# Patient Record
Sex: Female | Born: 1980 | Race: Black or African American | Hispanic: No | Marital: Married | Smoking: Never smoker
Health system: Southern US, Community
[De-identification: ages and names within clinical notes are randomized; demographics above are authoritative.]

## PROBLEM LIST (undated history)

## (undated) DIAGNOSIS — I10 Essential (primary) hypertension: Secondary | ICD-10-CM

## (undated) HISTORY — PX: APPENDECTOMY: SHX54

---

## 2018-12-16 DIAGNOSIS — I10 Essential (primary) hypertension: Secondary | ICD-10-CM | POA: Insufficient documentation

## 2020-02-16 DIAGNOSIS — R87810 Cervical high risk human papillomavirus (HPV) DNA test positive: Secondary | ICD-10-CM | POA: Insufficient documentation

## 2020-02-16 DIAGNOSIS — R87610 Atypical squamous cells of undetermined significance on cytologic smear of cervix (ASC-US): Secondary | ICD-10-CM | POA: Insufficient documentation

## 2020-09-03 ENCOUNTER — Ambulatory Visit: Admit: 2020-09-03 | Disposition: A | Discharge status: Home / Self Care | Payer: Self-pay

## 2020-09-03 ENCOUNTER — Ambulatory Visit
Admission: EM | Admit: 2020-09-03 | Discharge: 2020-09-03 | Disposition: A | Discharge status: Home / Self Care | Payer: 59 | Attending: Family Medicine | Admitting: Family Medicine

## 2020-09-03 ENCOUNTER — Other Ambulatory Visit: Payer: Self-pay

## 2020-09-03 ENCOUNTER — Emergency Department: Payer: 59

## 2020-09-03 ENCOUNTER — Encounter: Payer: Self-pay | Admitting: *Deleted

## 2020-09-03 ENCOUNTER — Emergency Department
Admission: EM | Admit: 2020-09-03 | Discharge: 2020-09-03 | Disposition: A | Discharge status: Home / Self Care | Payer: 59 | Attending: Student in an Organized Health Care Education/Training Program | Admitting: Student in an Organized Health Care Education/Training Program

## 2020-09-03 DIAGNOSIS — I10 Essential (primary) hypertension: Secondary | ICD-10-CM

## 2020-09-03 DIAGNOSIS — R079 Chest pain, unspecified: Secondary | ICD-10-CM | POA: Diagnosis not present

## 2020-09-03 DIAGNOSIS — R519 Headache, unspecified: Secondary | ICD-10-CM | POA: Diagnosis present

## 2020-09-03 DIAGNOSIS — Z91040 Latex allergy status: Secondary | ICD-10-CM | POA: Diagnosis not present

## 2020-09-03 DIAGNOSIS — Z79899 Other long term (current) drug therapy: Secondary | ICD-10-CM | POA: Diagnosis not present

## 2020-09-03 DIAGNOSIS — R9431 Abnormal electrocardiogram [ECG] [EKG]: Secondary | ICD-10-CM | POA: Diagnosis not present

## 2020-09-03 HISTORY — DX: Essential (primary) hypertension: I10

## 2020-09-03 LAB — BASIC METABOLIC PANEL
Anion gap: 7 (ref 5–15)
BUN: 13 mg/dL (ref 6–20)
CO2: 23 mmol/L (ref 22–32)
Calcium: 9 mg/dL (ref 8.9–10.3)
Chloride: 107 mmol/L (ref 98–111)
Creatinine, Ser: 0.81 mg/dL (ref 0.44–1.00)
GFR, Estimated: >60 mL/min (ref >60)
Glucose, Bld: 82 mg/dL (ref 70–99)
Potassium: 4.2 mmol/L (ref 3.5–5.1)
Sodium: 137 mmol/L (ref 135–145)

## 2020-09-03 LAB — CBC
HCT: 39 % (ref 36.0–46.0)
Hemoglobin: 13 g/dL (ref 12.0–15.0)
MCH: 28.6 pg (ref 26.0–34.0)
MCHC: 33.3 g/dL (ref 30.0–36.0)
MCV: 85.7 fL (ref 80.0–100.0)
Platelets: 353 10*3/uL (ref 150–400)
RBC: 4.55 MIL/uL (ref 3.87–5.11)
RDW: 16 % — ABNORMAL HIGH (ref 11.5–15.5)
WBC: 5.8 10*3/uL (ref 4.0–10.5)
nRBC: 0 % (ref 0.0–0.2)

## 2020-09-03 LAB — TROPONIN I (HIGH SENSITIVITY)
Troponin I (High Sensitivity): 4 ng/L (ref <18)
Troponin I (High Sensitivity): 4 ng/L (ref <18)

## 2020-09-03 LAB — FIBRIN DERIVATIVES D-DIMER (ARMC ONLY): Fibrin derivatives D-dimer (ARMC): 258.11 ng/mL (FEU) (ref 0.00–499.00)

## 2020-09-03 MED ORDER — PROCHLORPERAZINE MALEATE 10 MG PO TABS
10.0000 mg | ORAL_TABLET | Freq: Once | ORAL | Status: DC
  Filled 2020-09-03: qty 1

## 2020-09-03 MED ORDER — PROCHLORPERAZINE MALEATE 5 MG PO TABS
5.0000 mg | ORAL_TABLET | Freq: Once | ORAL | Status: DC
  Filled 2020-09-03: qty 1

## 2020-09-03 NOTE — ED Triage Notes (Signed)
Pt sent from urgent care for eval of headache for 2 weeks and chest pain since this am.   No sob.  Nonsmoker.  Hx htn.  Pt taking otc meds with some relief.  Pt alert  Speech clear.

## 2020-09-03 NOTE — Discharge Instructions (Addendum)
Go to the ER for further evaluation and treatment. 

## 2020-09-03 NOTE — ED Provider Notes (Signed)
[facility]   [id number] 09/03/20 Arrival Time: 1243  CC: HEADACHE  SUBJECTIVE:  Penny Davis is a 39 y.o. female who complains of headache for the last week that has progressively gotten worse. Denies a precipitating event, or recent head trauma. Has taken OTC medications with [place] relief. Patient localizes pain to the right side of the head. Has hx hypertension and takes Lotrel 5-10mg  daily. Describes the pain as constant and throbbing in character. There are not alleviating or aggravating factors. Reports that she has been having increased blood pressures at home. Reports that she has been having an increase in the number of headaches she has had over the last year. States that when she checks her BP when she has a headache, it is usually high. Reports similar symptoms in the past that improved with time. Reports left sided chest and back pain that began today as well. States that she has never had chest pain like this before. This is not the worst headache of their life. Patient denies fever, chills, nausea, vomiting, aura, rhinorrhea, watery eyes,  SOB, abdominal pain, weakness, numbness or tingling, slurred speech.     ROS: As per HPI.  All other pertinent ROS negative.     Past Medical History:  Diagnosis Date  . Hypertension    Past Surgical History:  Procedure Laterality Date  . APPENDECTOMY     Allergies  Allergen Reactions  . Latex   . Sulfa Antibiotics    No current facility-administered medications on file prior to encounter.   Current Outpatient Medications on File Prior to Encounter  Medication Sig Dispense Refill  . amLODipine-benazepril (LOTREL) 5-10 MG capsule Take 1 capsule by mouth daily.     Social History   Socioeconomic History  . Marital status: Married    Spouse name: Not on file  . Number of children: Not on file  . Years of education: Not on file  . Highest education level: Not on file  Occupational History  . Not on file  Tobacco  Use  . Smoking status: Never Smoker  . Smokeless tobacco: Never Used  Substance and Sexual Activity  . Alcohol use: Not on file  . Drug use: Not on file  . Sexual activity: Not on file  Other Topics Concern  . Not on file  Social History Narrative  . Not on file   Social Determinants of Health   Financial Resource Strain:   . Difficulty of Paying Living Expenses: Not on file  Food Insecurity:   . Worried About Programme researcher, broadcasting/film/video in the Last Year: Not on file  . Ran Out of Food in the Last Year: Not on file  Transportation Needs:   . Lack of Transportation (Medical): Not on file  . Lack of Transportation (Non-Medical): Not on file  Physical Activity:   . Days of Exercise per Week: Not on file  . Minutes of Exercise per Session: Not on file  Stress:   . Feeling of Stress : Not on file  Social Connections:   . Frequency of Communication with Friends and Family: Not on file  . Frequency of Social Gatherings with Friends and Family: Not on file  . Attends Religious Services: Not on file  . Active Member of Clubs or Organizations: Not on file  . Attends Banker Meetings: Not on file  . Marital Status: Not on file  Intimate Partner Violence:   . Fear of Current or Ex-Partner: Not on file  . Emotionally  Abused: Not on file  . Physically Abused: Not on file  . Sexually Abused: Not on file   History reviewed. No pertinent family history.  OBJECTIVE:  Vitals:   09/03/20 1346  BP: (!) 137/101  Pulse: 69  Resp: 17  Temp: 98.8 F (37.1 C)  TempSrc: Oral  SpO2: 96%    General appearance: alert; no distress Eyes: PERRLA; EOMI HENT: normocephalic; atraumatic Neck: supple with FROM Lungs: clear to auscultation bilaterally Heart: regular rate and rhythm.  Radial pulses 2+ symmetrical bilaterally Extremities: no edema; symmetrical with no gross deformities Skin: warm and dry Neurologic: CN 2-12 grossly intact; finger to nose without difficulty; normal gait;  strength and sensation intact bilaterally about the upper and lower extremities; negative pronator drift Psychological: alert and cooperative; normal mood and affect   ASSESSMENT & PLAN:  1. Chest pain, unspecified type   2. Nonintractable headache, unspecified chronicity pattern, unspecified headache type   3. Essential hypertension   4. Nonspecific abnormal electrocardiogram (ECG) (EKG)     Discussed with patient that given her history of high blood pressure, chest pain and headache today that she would be better served in the ER for further workup Patient takes hormonal birth control as well EKG today shows nonspecific t-wave abnormality, shows possible anterior ischemia, no STEMI No previous EKG for comparison Also discussed that we cannot rule out PE in the office as well Patient declines EMS and states that she will follow up in the ER via personal vehicle Stable at discharge   Moshe Cipro, NP 09/03/20 1439

## 2020-09-03 NOTE — ED Triage Notes (Signed)
Patient reports right sided headache, constant in nature that has progressively worsened, x 1 week. Denies any associated symptoms with headache.

## 2020-09-03 NOTE — ED Provider Notes (Signed)
[facility] Emergency Department Provider Note    First MD Initiated Contact with Patient 09/03/20 1807     (approximate)  I have reviewed the triage vital signs and the nursing notes.   HISTORY  Chief Complaint Headache and Chest Pain    HPI Penny Davis is a 39 y.o. female with the below listed past medical history presents to ER from urgent care due to chest discomfort that started this morning.  No history of chest pain.  Has been having daily headaches for the past several days.  Has a history of chronic recurrent non-intractable headaches.  Has been taking over-the-counter medication.  She did not go to urgent care for the headache.  Denies any measured fevers.  Does have some discomfort with taking deep inspiration is on birth control.  No previous history of heart disease or ureter DVT.    Past Medical History:  Diagnosis Date  . Hypertension    No family history on file. Past Surgical History:  Procedure Laterality Date  . APPENDECTOMY     There are no problems to display for this patient.     Prior to Admission medications   Medication Sig Start Date End Date Taking? Authorizing Provider  amLODipine-benazepril (LOTREL) 5-10 MG capsule Take 1 capsule by mouth daily.    [provider]    Allergies Latex and Sulfa antibiotics    Social History Social History   Tobacco Use  . Smoking status: Never Smoker  . Smokeless tobacco: Never Used  Substance Use Topics  . Alcohol use: Not Currently  . Drug use: Not Currently    Review of Systems Patient denies headaches, rhinorrhea, blurry vision, numbness, shortness of breath, chest pain, edema, cough, abdominal pain, nausea, vomiting, diarrhea, dysuria, fevers, rashes or hallucinations unless otherwise stated above in HPI. ____________________________________________   PHYSICAL EXAM:  VITAL SIGNS: Vitals:   09/03/20 1534 09/03/20 1830  BP: (!) 141/83 (!) 151/99    Pulse: 74 66  Resp: 18 19  Temp: 98.2 F (36.8 C)   SpO2: 99% 100%    Constitutional: Alert and oriented.  Eyes: Conjunctivae are normal.  Head: Atraumatic. Nose: No congestion/rhinnorhea. Mouth/Throat: Mucous membranes are moist.   Neck: No stridor. Painless ROM.  Cardiovascular: Normal rate, regular rhythm. Grossly normal heart sounds.  Good peripheral circulation. Respiratory: Normal respiratory effort.  No retractions. Lungs CTAB. Gastrointestinal: Soft and nontender. No distention. No abdominal bruits. No CVA tenderness. Genitourinary:  Musculoskeletal: No lower extremity tenderness nor edema.  No joint effusions. Neurologic:  Normal speech and language. No gross focal neurologic deficits are appreciated. No facial droop Skin:  Skin is warm, dry and intact. No rash noted. Psychiatric: Mood and affect are normal. Speech and behavior are normal.  ____________________________________________   LABS (all labs ordered are listed, but only abnormal results are displayed)  Results for orders placed or performed during the hospital encounter of 09/03/20 (from the past 24 hour(s))  Basic metabolic panel     Status: None   Collection Time: 09/03/20  3:36 PM  Result Value Ref Range   Sodium 137 135 - 145 mmol/L   Potassium 4.2 3.5 - 5.1 mmol/L   Chloride 107 98 - 111 mmol/L   CO2 23 22 - 32 mmol/L   Glucose, Bld 82 70 - 99 mg/dL   BUN 13 6 - 20 mg/dL   Creatinine, Ser 4.76 0.44 - 1.00 mg/dL   Calcium 9.0 8.9 - 54.6 mg/dL   GFR, Estimated >50 >35  mL/min   Anion gap 7 5 - 15  CBC     Status: Abnormal   Collection Time: 09/03/20  3:36 PM  Result Value Ref Range   WBC 5.8 4.0 - 10.5 K/uL   RBC 4.55 3.87 - 5.11 MIL/uL   Hemoglobin 13.0 12.0 - 15.0 g/dL   HCT 42.3 36 - 46 %   MCV 85.7 80.0 - 100.0 fL   MCH 28.6 26.0 - 34.0 pg   MCHC 33.3 30.0 - 36.0 g/dL   RDW 53.6 (H) 14.4 - 31.5 %   Platelets 353 150 - 400 K/uL   nRBC 0.0 0.0 - 0.2 %  Troponin I (High Sensitivity)      Status: None   Collection Time: 09/03/20  3:36 PM  Result Value Ref Range   Troponin I (High Sensitivity) 4 <18 ng/L  Troponin I (High Sensitivity)     Status: None   Collection Time: 09/03/20  6:32 PM  Result Value Ref Range   Troponin I (High Sensitivity) 4 <18 ng/L  Fibrin derivatives D-Dimer ([facility] only)     Status: None   Collection Time: 09/03/20  6:32 PM  Result Value Ref Range   Fibrin derivatives D-dimer ([facility]) 258.11 0.00 - 499.00 ng/mL (FEU)   ____________________________________________  EKG My review and personal interpretation at Time: 15:35   Indication: chest pain  Rate: 65  Rhythm: sinus Axis: normal Other: normal intervals, nonspecific twave abn ____________________________________________  RADIOLOGY  I personally reviewed all radiographic images ordered to evaluate for the above acute complaints and reviewed radiology reports and findings.  These findings were personally discussed with the patient.  Please see medical record for radiology report.  ____________________________________________   PROCEDURES  Procedure(s) performed:  Procedures    Critical Care performed: no ____________________________________________   INITIAL IMPRESSION / ASSESSMENT AND PLAN / ED COURSE  Pertinent labs & imaging results that were available during my care of the patient were reviewed by me and considered in my medical decision making (see chart for details).   DDX: ACS, pericarditis, esophagitis, boerhaaves, pe, dissection, pna, bronchitis, costochondritis   Penny Davis is a 39 y.o. who presents to the ED with symptoms as described above.  Patient is nontoxic-appearing with reassuring physical exam.  EKG with some nonspecific T wave abnormalities.  May be secondary to her history of hypertension.  She is low risk by Wells criteria not hypoxic not tachycardic but she is on birth control therefore will send off D-dimer to further stratify for PE.  Does not seem  consistent with pneumothorax, CHF, infectious process.  Clinical Course as of Sep 03 1917  Mon Sep 03, 2020  1916 Patient reassessed.  Declining any analgesics.  Cardiac pulmonary work-up is reassuring.  Not consistent with PE or ACS.  Not consistent dissection.  Exam is otherwise reassuring.  Appropriate for outpatient follow-up.   [PR]    Clinical Course User Index [PR] Willy Eddy, MD    The patient was evaluated in Emergency Department today for the symptoms described in the history of present illness. He/she was evaluated in the context of the global COVID-19 pandemic, which necessitated consideration that the patient might be at risk for infection with the SARS-CoV-2 virus that causes COVID-19. Institutional protocols and algorithms that pertain to the evaluation of patients at risk for COVID-19 are in a state of rapid change based on information released by regulatory bodies including the CDC and federal and state organizations. These policies and algorithms were followed during the patient's  care in the ED.  As part of my medical decision making, I reviewed the following data within the electronic MEDICAL RECORD NUMBER Nursing notes reviewed and incorporated, Labs reviewed, notes from prior ED visits and [place] Controlled Substance Database   ____________________________________________   FINAL CLINICAL IMPRESSION(S) / ED DIAGNOSES  Final diagnoses:  Chest pain, unspecified type  Hypertension, unspecified type      NEW MEDICATIONS STARTED DURING THIS VISIT:  New Prescriptions   No medications on file     Note:  This document was prepared using Dragon voice recognition software and may include unintentional dictation errors.    Willy Eddy, MD 09/03/20 [phone]

## 2020-09-13 ENCOUNTER — Other Ambulatory Visit: Payer: Self-pay

## 2020-09-13 ENCOUNTER — Encounter: Payer: Self-pay | Admitting: Cardiology

## 2020-09-13 ENCOUNTER — Telehealth: Payer: Self-pay | Admitting: Cardiology

## 2020-09-13 ENCOUNTER — Ambulatory Visit: Payer: 59 | Admitting: Cardiology

## 2020-09-13 VITALS — BP 120/88 | HR 72 | Ht 66.0 in | Wt 202.0 lb

## 2020-09-13 DIAGNOSIS — R072 Precordial pain: Secondary | ICD-10-CM

## 2020-09-13 DIAGNOSIS — I10 Essential (primary) hypertension: Secondary | ICD-10-CM

## 2020-09-13 DIAGNOSIS — R079 Chest pain, unspecified: Secondary | ICD-10-CM

## 2020-09-13 MED ORDER — METOPROLOL TARTRATE 100 MG PO TABS: 100.0000 mg | ORAL_TABLET | Freq: Once | ORAL | 0 refills | Status: AC

## 2020-09-13 NOTE — Telephone Encounter (Signed)
Per checkout needs fu ct and echo

## 2020-09-13 NOTE — Progress Notes (Signed)
Cardiology Office Note:    Date:  09/13/2020   ID:  Penny Davis, DOB 12-Oct-1981, MRN [id number]  PCP:  Wyman Songster, NP  [facility] Cardiologist:  Kate Sable, MD  [place] Electrophysiologist:  None   Referring MD: Merlyn Lot, MD   Chief Complaint  Patient presents with  . New Patient (Initial Visit)    Establish care with provider for abnormal EKG and chest pains. Per patient she is still having headaches and chest pains off and on. Medications verbally reviewed with patient.    Penny Davis is a 39 y.o. female who is being seen today for the evaluation of abnormal EKG, chest pain at the request of Merlyn Lot, MD.   History of Present Illness:    Penny Davis is a 39 y.o. female with a hx of hypertension who presents due to chest pain and abnormal ECG.  Patient states having a 2 to 3-day history of chest pain about a week ago.  She was at the grocery store with her husband when symptoms began.  Describes symptoms as pressure, 5 out of 10 in severity located in the mid chest section.  Symptoms were persistent prompting her to go to an urgent care.  EKG at the urgent care was noted to be of normal.  She was then referred to the ED.  Patient was seen in the ED on 10/25 due to chest pain.  Troponins were normal, EKG showed nonspecific T wave changes.  D-dimers were normal.  Outpatient follow-up with cardiology was recommended.  She has since had occasional chest pain on and off not related with exertion.  Endorses shortness of breath with exertion especially when going up stairs.  Denies smoking, or history of heart disease.  Past Medical History:  Diagnosis Date  . Hypertension     Past Surgical History:  Procedure Laterality Date  . APPENDECTOMY      Current Medications: Current Meds  Medication Sig  . amLODipine (NORVASC) 5 MG tablet Take by mouth.  . medroxyPROGESTERone Acetate 150 MG/ML SUSY Inject 1 mL into the muscle every 3  (three) months.     Allergies:   Latex and Sulfa antibiotics   Social History   Socioeconomic History  . Marital status: Married    Spouse name: Not on file  . Number of children: Not on file  . Years of education: Not on file  . Highest education level: Not on file  Occupational History  . Not on file  Tobacco Use  . Smoking status: Never Smoker  . Smokeless tobacco: Never Used  Substance and Sexual Activity  . Alcohol use: Not Currently  . Drug use: Not Currently  . Sexual activity: Yes  Other Topics Concern  . Not on file  Social History Narrative  . Not on file   Social Determinants of Health   Financial Resource Strain:   . Difficulty of Paying Living Expenses: Not on file  Food Insecurity:   . Worried About Charity fundraiser in the Last Year: Not on file  . Ran Out of Food in the Last Year: Not on file  Transportation Needs:   . Lack of Transportation (Medical): Not on file  . Lack of Transportation (Non-Medical): Not on file  Physical Activity:   . Days of Exercise per Week: Not on file  . Minutes of Exercise per Session: Not on file  Stress:   . Feeling of Stress : Not on file  Social Connections:   .  Frequency of Communication with Friends and Family: Not on file  . Frequency of Social Gatherings with Friends and Family: Not on file  . Attends Religious Services: Not on file  . Active Member of Clubs or Organizations: Not on file  . Attends Archivist Meetings: Not on file  . Marital Status: Not on file     Family History: The patient's family history is not on file.  ROS:   Please see the history of present illness.     All other systems reviewed and are negative.  EKGs/Labs/Other Studies Reviewed:    The following studies were reviewed today:   EKG:  EKG is  ordered today.  The ekg ordered today demonstrates normal sinus rhythm  Recent Labs: 09/03/2020: BUN 13; Creatinine, Ser 0.81; Hemoglobin 13.0; Platelets 353; Potassium 4.2;  Sodium 137  Recent Lipid Panel No results found for: CHOL, TRIG, HDL, CHOLHDL, VLDL, LDLCALC, LDLDIRECT   Risk Assessment/Calculations:      Physical Exam:    VS:  BP 120/88 (BP Location: Right Arm, Patient Position: Sitting, Cuff Size: Normal)   Pulse 72   Ht $R'5\' 6"'IA$  (1.676 m)   Wt 202 lb (91.6 kg)   LMP  (LMP Unknown)   SpO2 98%   BMI 32.60 kg/m     Wt Readings from Last 3 Encounters:  09/13/20 202 lb (91.6 kg)  09/03/20 202 lb (91.6 kg)     GEN:  Well nourished, well developed in no acute distress HEENT: Normal NECK: No JVD; No carotid bruits LYMPHATICS: No lymphadenopathy CARDIAC: RRR, no murmurs, rubs, gallops RESPIRATORY:  Clear to auscultation without rales, wheezing or rhonchi  ABDOMEN: Soft, non-tender, non-distended MUSCULOSKELETAL:  No edema; tenderness on the left chest noted on exam, different location from prior chest pain occurrence. SKIN: Warm and dry NEUROLOGIC:  Alert and oriented x 3 PSYCHIATRIC:  Normal affect   ASSESSMENT:    1. Chest pain of uncertain etiology   2. Primary hypertension   3. Precordial pain    PLAN:    In order of problems listed above:  1. Patient with chest pain, risk factors of hypertension.  She also endorses dyspnea on exertion.  Will evaluate patient with echocardiogram for any cardiac dysfunction.  Get coronary CTA to evaluate presence of CAD. 2. History of hypertension, BP controlled on Norvasc.  Follow-up after echo and coronary CTA.   Medication Adjustments/Labs and Tests Ordered: Current medicines are reviewed at length with the patient today.  Concerns regarding medicines are outlined above.  Orders Placed This Encounter  Procedures  . CT CORONARY MORPH W/CTA COR W/SCORE W/CA W/CM &/OR WO/CM  . CT CORONARY FRACTIONAL FLOW RESERVE DATA PREP  . CT CORONARY FRACTIONAL FLOW RESERVE FLUID ANALYSIS  . EKG 12-Lead  . ECHOCARDIOGRAM COMPLETE   Meds ordered this encounter  Medications  . metoprolol tartrate  (LOPRESSOR) 100 MG tablet    Sig: Take 1 tablet (100 mg total) by mouth once for 1 dose. Take 2 hours prior to your CT scan.    Dispense:  1 tablet    Refill:  0    Patient Instructions  Medication Instructions:   Your physician recommends that you continue on your current medications as directed. Please refer to the Current Medication list given to you today.  *If you need a refill on your cardiac medications before your next appointment, please call your pharmacy*   Lab Work: None Ordered If you have labs (blood work) drawn today and your tests are  completely normal, you will receive your results only by: Marland Kitchen MyChart Message (if you have MyChart) OR . A paper copy in the mail If you have any lab test that is abnormal or we need to change your treatment, we will call you to review the results.   Testing/Procedures:  1.  Your physician has requested that you have an echocardiogram. Echocardiography is a painless test that uses sound waves to create images of your heart. It provides your doctor with information about the size and shape of your heart and how well your heart's chambers and valves are working. This procedure takes approximately one hour. There are no restrictions for this procedure.  2.  Your physician has requested that you have cardiac CT. Cardiac computed tomography (CT) is a painless test that uses an x-ray machine to take clear, detailed pictures of your heart.  Your cardiac CT will be scheduled at:  [facility] [address] [place], [place] [address] [phone]  Please arrive 15 mins early for check-in and test prep.   Please follow these instructions carefully (unless otherwise directed):   On the Night Before the Test:   HOLD Norvasc the night prior to your test . Be sure to Drink plenty of water. . Do not consume any caffeinated/decaffeinated beverages or chocolate 12 hours prior to your test. . Do not  take any antihistamines 12 hours prior to your test.   On the Day of the Test: . Drink plenty of water. Do not drink any water within one hour of the test. . Do not eat any food 4 hours prior to the test. . You may take your regular medications prior to the test.  . Take metoprolol (Lopressor) two hours prior to test. . FEMALES- please wear underwire-free bra if available        After the Test: . Drink plenty of water. . After receiving IV contrast, you may experience a mild flushed feeling. This is normal. . On occasion, you may experience a mild rash up to 24 hours after the test. This is not dangerous. If this occurs, you can take Benadryl 25 mg and increase your fluid intake. . If you experience trouble breathing, this can be serious. If it is severe call 911 IMMEDIATELY. If it is mild, please call our office. . If you take any of these medications: Glipizide/Metformin, Avandament, Glucavance, please do not take 48 hours after completing test unless otherwise instructed.   Once we have confirmed authorization from your insurance company, we will call you to set up a date and time for your test. Based on how quickly your insurance processes prior authorizations requests, please allow up to 4 weeks to be contacted for scheduling your Cardiac CT appointment. Be advised that routine Cardiac CT appointments could be scheduled as many as 8 weeks after your provider has ordered it.  For non-scheduling related questions, please contact the cardiac imaging nurse navigator should you have any questions/concerns: Marchia Bond, Cardiac Imaging Nurse Navigator Burley Saver, Interim Cardiac Imaging Nurse [place] [facility] Direct Office Dial: [phone]   For scheduling needs, including cancellations and rescheduling, please call Vivien Rota at [phone], option 3.     Follow-Up: At [facility], you and your health needs are our priority.  As part of our continuing  mission to provide you with exceptional heart care, we have created designated Provider Care Teams.  These Care Teams include your primary Cardiologist (physician) and Advanced  Practice Providers (APPs -  Physician Assistants and Nurse Practitioners) who all work together to provide you with the care you need, when you need it.  We recommend signing up for the patient portal called "MyChart".  Sign up information is provided on this After Visit Summary.  MyChart is used to connect with patients for Virtual Visits (Telemedicine).  Patients are able to view lab/test results, encounter notes, upcoming appointments, etc.  Non-urgent messages can be sent to your provider as well.   To learn more about what you can do with MyChart, go to [email or web address].    Your next appointment:   Follow up after Echo and CTA   The format for your next appointment:   In Person  Provider:   Kate Sable, MD   Other Instructions      Signed, Kate Sable, MD  09/13/2020 11:45 AM    Mobile

## 2020-09-13 NOTE — Patient Instructions (Signed)
Medication Instructions:   Your physician recommends that you continue on your current medications as directed. Please refer to the Current Medication list given to you today.  *If you need a refill on your cardiac medications before your next appointment, please call your pharmacy*   Lab Work: None Ordered If you have labs (blood work) drawn today and your tests are completely normal, you will receive your results only by: Marland Kitchen MyChart Message (if you have MyChart) OR . A paper copy in the mail If you have any lab test that is abnormal or we need to change your treatment, we will call you to review the results.   Testing/Procedures:  1.  Your physician has requested that you have an echocardiogram. Echocardiography is a painless test that uses sound waves to create images of your heart. It provides your doctor with information about the size and shape of your heart and how well your heart's chambers and valves are working. This procedure takes approximately one hour. There are no restrictions for this procedure.  2.  Your physician has requested that you have cardiac CT. Cardiac computed tomography (CT) is a painless test that uses an x-ray machine to take clear, detailed pictures of your heart.  Your cardiac CT will be scheduled at:  [facility] [address] [place], [place] [address] [phone]  Please arrive 15 mins early for check-in and test prep.   Please follow these instructions carefully (unless otherwise directed):   On the Night Before the Test:   HOLD Norvasc the night prior to your test . Be sure to Drink plenty of water. . Do not consume any caffeinated/decaffeinated beverages or chocolate 12 hours prior to your test. . Do not take any antihistamines 12 hours prior to your test.   On the Day of the Test: . Drink plenty of water. Do not drink any water within one hour of the test. . Do not eat any food 4 hours  prior to the test. . You may take your regular medications prior to the test.  . Take metoprolol (Lopressor) two hours prior to test. . FEMALES- please wear underwire-free bra if available        After the Test: . Drink plenty of water. . After receiving IV contrast, you may experience a mild flushed feeling. This is normal. . On occasion, you may experience a mild rash up to 24 hours after the test. This is not dangerous. If this occurs, you can take Benadryl 25 mg and increase your fluid intake. . If you experience trouble breathing, this can be serious. If it is severe call 911 IMMEDIATELY. If it is mild, please call our office. . If you take any of these medications: Glipizide/Metformin, Avandament, Glucavance, please do not take 48 hours after completing test unless otherwise instructed.   Once we have confirmed authorization from your insurance company, we will call you to set up a date and time for your test. Based on how quickly your insurance processes prior authorizations requests, please allow up to 4 weeks to be contacted for scheduling your Cardiac CT appointment. Be advised that routine Cardiac CT appointments could be scheduled as many as 8 weeks after your provider has ordered it.  For non-scheduling related questions, please contact the cardiac imaging nurse navigator should you have any questions/concerns: Marchia Bond, Cardiac Imaging Nurse Navigator Burley Saver, Interim Cardiac Imaging Nurse [place] [facility] Direct Office Dial: [phone]  For scheduling needs, including cancellations and rescheduling, please call Vivien Rota at [phone], option 3.     Follow-Up: At [facility], you and your health needs are our priority.  As part of our continuing mission to provide you with exceptional heart care, we have created designated Provider Care Teams.  These Care Teams include your primary Cardiologist (physician) and Advanced Practice  Providers (APPs -  Physician Assistants and Nurse Practitioners) who all work together to provide you with the care you need, when you need it.  We recommend signing up for the patient portal called "MyChart".  Sign up information is provided on this After Visit Summary.  MyChart is used to connect with patients for Virtual Visits (Telemedicine).  Patients are able to view lab/test results, encounter notes, upcoming appointments, etc.  Non-urgent messages can be sent to your provider as well.   To learn more about what you can do with MyChart, go to [email or web address].    Your next appointment:   Follow up after Echo and CTA   The format for your next appointment:   In Person  Provider:   Kate Sable, MD   Other Instructions

## 2020-09-21 ENCOUNTER — Ambulatory Visit: Payer: 59 | Admitting: Cardiology

## 2020-09-26 ENCOUNTER — Telehealth (HOSPITAL_COMMUNITY): Payer: Self-pay | Admitting: *Deleted

## 2020-09-26 NOTE — Telephone Encounter (Signed)

## 2020-09-27 ENCOUNTER — Other Ambulatory Visit: Payer: Self-pay

## 2020-09-27 ENCOUNTER — Ambulatory Visit
Admission: RE | Admit: 2020-09-27 | Discharge: 2020-09-27 | Disposition: A | Discharge status: Home / Self Care | Payer: 59 | Source: Ambulatory Visit | Attending: Cardiology | Admitting: Cardiology

## 2020-09-27 DIAGNOSIS — R072 Precordial pain: Secondary | ICD-10-CM

## 2020-09-27 MED ORDER — DILTIAZEM HCL 25 MG/5ML IV SOLN: 10.0000 mg | Freq: Once | INTRAVENOUS | Status: DC

## 2020-09-27 MED ORDER — IOHEXOL 350 MG/ML SOLN
100.0000 mL | Freq: Once | INTRAVENOUS | Status: AC | PRN
  Administered 2020-09-27: 85 mL via INTRAVENOUS

## 2020-09-27 MED ORDER — NITROGLYCERIN 0.4 MG SL SUBL
0.8000 mg | SUBLINGUAL_TABLET | Freq: Once | SUBLINGUAL | Status: AC
  Administered 2020-09-27: 0.8 mg via SUBLINGUAL

## 2020-09-27 NOTE — Progress Notes (Signed)
Patient tolerated procedure well. Ambulate w/o difficulty. Sitting in chair drinking water provided. Encouraged to drink extra water today and reasoning explained. Verbalized understanding. All questions answered. ABC intact. No further needs. Discharge from procedure area w/o issues.  

## 2020-10-09 ENCOUNTER — Ambulatory Visit: Payer: 59 | Admitting: Podiatry

## 2020-10-09 ENCOUNTER — Other Ambulatory Visit: Payer: Self-pay

## 2020-10-09 ENCOUNTER — Ambulatory Visit (INDEPENDENT_AMBULATORY_CARE_PROVIDER_SITE_OTHER): Payer: 59

## 2020-10-09 DIAGNOSIS — B351 Tinea unguium: Secondary | ICD-10-CM | POA: Diagnosis not present

## 2020-10-09 DIAGNOSIS — R079 Chest pain, unspecified: Secondary | ICD-10-CM | POA: Diagnosis not present

## 2020-10-09 DIAGNOSIS — M2012 Hallux valgus (acquired), left foot: Secondary | ICD-10-CM | POA: Diagnosis not present

## 2020-10-09 DIAGNOSIS — M79675 Pain in left toe(s): Secondary | ICD-10-CM

## 2020-10-09 DIAGNOSIS — M79674 Pain in right toe(s): Secondary | ICD-10-CM

## 2020-10-09 DIAGNOSIS — L600 Ingrowing nail: Secondary | ICD-10-CM | POA: Diagnosis not present

## 2020-10-09 MED ORDER — TERBINAFINE HCL 250 MG PO TABS: 250.0000 mg | ORAL_TABLET | Freq: Every day | ORAL | 0 refills | Status: AC

## 2020-10-09 NOTE — Progress Notes (Signed)
   Subjective: 39 y.o. female presents today as a new patient for evaluation of a bunion that is developed to the left foot over the past few years.  Patient states is minimally symptomatic but it is more so unsightly.  She would like to discuss treatment options Patient also states that she has suffered for years for ingrown toenails.  She routinely goes to a pedicure salon to have them trimmed with minimal relief.  She has constant aches and pains to the bilateral medial aspect of the hallux nail plates. Finally the patient states that most recently she began going to a new pedicure salon.  When she began going she noticed discoloration and possible toenail fungus to the bilateral hallux nail plates lateral distal corner   Past Medical History:  Diagnosis Date  . Hypertension       Objective: Physical Exam General: The patient is alert and oriented x3 in no acute distress.  Dermatology: Skin is cool, dry and supple bilateral lower extremities. Negative for open lesions or macerations.  Incurvated nail noted to the medial border of the bilateral great toes consistent with a chronic ingrown toenail Hyperkeratotic dystrophic discolored nails also noted to the distal lateral portion of the bilateral hallux nail plates  Vascular: Palpable pedal pulses bilaterally. No edema or erythema noted. Capillary refill within normal limits.  Neurological: Epicritic and protective threshold grossly intact bilaterally.   Musculoskeletal Exam: Clinical evidence of bunion deformity noted to the respective foot. There is moderate pain on palpation range of motion of the first MPJ. Lateral deviation of the hallux noted consistent with hallux abductovalgus.  Radiographic Exam: Increased intermetatarsal angle greater than 15 with a hallux abductus angle greater than 30 noted on AP view. Moderate degenerative changes noted within the first MPJ.  Assessment: 1. HAV w/ bunion deformity left 2.  Onychomycosis  of toenails bilateral great toes 3.  Ingrown toenails bilateral medial borders   Plan of Care:  1. Patient was evaluated. X-Rays reviewed. 2.  The patient would like to wait till after the holidays to address the ingrown toenails.  At that time we will likely perform partial permanent nail matricectomy 3.  Prescription for Lamisil 250 mg #90 daily.  Patient denies any liver pathology or history of liver impairment 4.  Return to clinic after the holidays for nail matricectomy procedures      Felecia Shelling, DPM [facility]  Dr. Felecia Shelling, DPM    [address]                                        [place], [place] [address]                Office [phone]  Fax [phone]

## 2020-10-10 LAB — ECHOCARDIOGRAM COMPLETE
Area-P 1/2: 3.48 cm2
S' Lateral: 3.2 cm

## 2020-10-12 ENCOUNTER — Telehealth: Payer: Self-pay

## 2020-10-12 NOTE — Telephone Encounter (Signed)
Patient received vm from nurse to review results.  She has read on mychart and understands normal results.   Patient has no further questions about results but wants to know if scheduled fu is now needed.    Patient is ok with leaving a detailed msg

## 2020-10-12 NOTE — Telephone Encounter (Signed)
Left a VM for patient to call back for Echo results.

## 2020-10-15 NOTE — Telephone Encounter (Signed)
Spoke with patient and gave her Dr. Merita Norton below copied and pasted response:  Okay to follow-up as needed   Routing comment    I cancelled appointment for patient and she was very grateful for the follow up.

## 2020-10-26 ENCOUNTER — Ambulatory Visit: Payer: 59 | Admitting: Cardiology

## 2020-11-13 ENCOUNTER — Ambulatory Visit: Payer: 59 | Admitting: Podiatry

## 2022-05-18 ENCOUNTER — Emergency Department: Payer: BC Managed Care – PPO

## 2022-05-18 ENCOUNTER — Encounter: Payer: Self-pay | Admitting: Intensive Care

## 2022-05-18 ENCOUNTER — Other Ambulatory Visit: Payer: Self-pay

## 2022-05-18 ENCOUNTER — Emergency Department
Admission: EM | Admit: 2022-05-18 | Discharge: 2022-05-18 | Disposition: A | Discharge status: Home / Self Care | Payer: BC Managed Care – PPO | Attending: Student in an Organized Health Care Education/Training Program | Admitting: Student in an Organized Health Care Education/Training Program

## 2022-05-18 DIAGNOSIS — M7989 Other specified soft tissue disorders: Secondary | ICD-10-CM

## 2022-05-18 DIAGNOSIS — R600 Localized edema: Secondary | ICD-10-CM | POA: Insufficient documentation

## 2022-05-18 DIAGNOSIS — M79662 Pain in left lower leg: Secondary | ICD-10-CM | POA: Diagnosis not present

## 2022-05-18 DIAGNOSIS — M79661 Pain in right lower leg: Secondary | ICD-10-CM | POA: Insufficient documentation

## 2022-05-18 DIAGNOSIS — I10 Essential (primary) hypertension: Secondary | ICD-10-CM | POA: Insufficient documentation

## 2022-05-18 LAB — COMPREHENSIVE METABOLIC PANEL
ALT: 15 U/L (ref 0–44)
AST: 19 U/L (ref 15–41)
Albumin: 3.8 g/dL (ref 3.5–5.0)
Alkaline Phosphatase: 58 U/L (ref 38–126)
Anion gap: 8 (ref 5–15)
BUN: 13 mg/dL (ref 6–20)
CO2: 24 mmol/L (ref 22–32)
Calcium: 8.8 mg/dL — ABNORMAL LOW (ref 8.9–10.3)
Chloride: 108 mmol/L (ref 98–111)
Creatinine, Ser: 0.83 mg/dL (ref 0.44–1.00)
GFR, Estimated: >60 mL/min (ref >60)
Glucose, Bld: 103 mg/dL — ABNORMAL HIGH (ref 70–99)
Potassium: 3.5 mmol/L (ref 3.5–5.1)
Sodium: 140 mmol/L (ref 135–145)
Total Bilirubin: 0.7 mg/dL (ref 0.3–1.2)
Total Protein: 7.5 g/dL (ref 6.5–8.1)

## 2022-05-18 LAB — CBC WITH DIFFERENTIAL/PLATELET
Abs Immature Granulocytes: 0.01 10*3/uL (ref 0.00–0.07)
Basophils Absolute: 0 10*3/uL (ref 0.0–0.1)
Basophils Relative: 0 %
Eosinophils Absolute: 0.2 10*3/uL (ref 0.0–0.5)
Eosinophils Relative: 3 %
HCT: 40.2 % (ref 36.0–46.0)
Hemoglobin: 13.2 g/dL (ref 12.0–15.0)
Immature Granulocytes: 0 %
Lymphocytes Relative: 40 %
Lymphs Abs: 1.9 10*3/uL (ref 0.7–4.0)
MCH: 30 pg (ref 26.0–34.0)
MCHC: 32.8 g/dL (ref 30.0–36.0)
MCV: 91.4 fL (ref 80.0–100.0)
Monocytes Absolute: 0.4 10*3/uL (ref 0.1–1.0)
Monocytes Relative: 8 %
Neutro Abs: 2.4 10*3/uL (ref 1.7–7.7)
Neutrophils Relative %: 49 %
Platelets: 287 10*3/uL (ref 150–400)
RBC: 4.4 MIL/uL (ref 3.87–5.11)
RDW: 13.2 % (ref 11.5–15.5)
WBC: 4.9 10*3/uL (ref 4.0–10.5)
nRBC: 0 % (ref 0.0–0.2)

## 2022-05-18 MED ORDER — CEPHALEXIN 500 MG PO CAPS: 500.0000 mg | ORAL_CAPSULE | Freq: Four times a day (QID) | ORAL | 0 refills | Status: AC

## 2022-05-18 NOTE — ED Triage Notes (Signed)
Patient has bilateral leg swelling. Right is worse. Redness and warm to touch noted.

## 2022-05-18 NOTE — ED Notes (Signed)
See triage note  Presents with swelling to both legs   Right leg is more swollen   and warm to touch

## 2022-05-18 NOTE — Discharge Instructions (Addendum)
-  Take all of antibiotics as prescribed.  -Please follow-up with your regular doctor if your symptoms fail to improve despite treatment.  -Return to the emergency department anytime if you begin to experience any new or worsening symptoms.

## 2022-05-18 NOTE — ED Provider Notes (Signed)
[facility] Provider Note    Event Date/Time   First MD Initiated Contact with Patient 05/18/22 1447     (approximate)   History   Chief Complaint Leg Swelling (bilateral)   HPI Penny Davis is a 41 y.o. female, history of hypertension, presents emergency department for evaluation of bilateral leg swelling.  Patient states that this been gradually developing over the past 2 weeks, noticed increased redness over the past few days, particularly on the right lower extremity.  Reports some pain and warmth along the lower legs.  No history of blood clots.  Denies recent surgeries.  Denies any recent environmental exposures or injuries.  Denies fever/chills, chest pain, shortness of breath, abdominal pain, flank pain, nausea/vomiting, diarrhea, dysuria, cold sensation in the lower extremities, or numbness/tingling in upper or lower extremities.  History Limitations: No limitations.        Physical Exam  Triage Vital Signs: ED Triage Vitals  Enc Vitals Group     BP 05/18/22 1430 (!) 158/106     Pulse Rate 05/18/22 1430 89     Resp 05/18/22 1430 16     Temp 05/18/22 1430 98.3 F (36.8 C)     Temp Source 05/18/22 1430 Oral     SpO2 05/18/22 1430 100 %     Weight 05/18/22 1431 228 lb (103.4 kg)     Height 05/18/22 1431 5\' 6"  (1.676 m)     Head Circumference --      Peak Flow --      Pain Score 05/18/22 1430 3     Pain Loc --      Pain Edu? --      Excl. in [place]? --     Most recent vital signs: Vitals:   05/18/22 1430  BP: (!) 158/106  Pulse: 89  Resp: 16  Temp: 98.3 F (36.8 C)  SpO2: 100%    General: Awake, NAD.  Skin: Warm, dry. No rashes or lesions.  Eyes: PERRL. Conjunctivae normal.  CV: Good peripheral perfusion.  Resp: Normal effort.  Abd: Soft, non-tender. No distention.  Neuro: At baseline. No gross neurological deficits.   Focused Exam: 2+ pitting edema bilaterally in the lower extremities, terminating along the mid calf region.   Warmth and erythema appreciated on the right lower extremity.  PMS intact distally in both lower extremities.  Patient maintains normal range of motion.  She is able to ambulate well on her own without assistance.  Physical Exam    ED Results / Procedures / Treatments  Labs (all labs ordered are listed, but only abnormal results are displayed) Labs Reviewed  COMPREHENSIVE METABOLIC PANEL - Abnormal; Notable for the following components:      Result Value   Glucose, Bld 103 (*)    Calcium 8.8 (*)    All other components within normal limits  CBC WITH DIFFERENTIAL/PLATELET     EKG N/A.   RADIOLOGY  ED Provider Interpretation: I personally reviewed and interpreted this ultrasound, no evidence of DVTs in either extremity.  07/19/22 Venous Img Lower Bilateral  Result Date: 05/18/2022 CLINICAL DATA:  Bilateral lower extremity swelling, right greater than left. EXAM: BILATERAL LOWER EXTREMITY VENOUS DOPPLER ULTRASOUND TECHNIQUE: Gray-scale sonography with compression, as well as color and duplex ultrasound, were performed to evaluate the deep venous system(s) from the level of the common femoral vein through the popliteal and proximal calf veins. COMPARISON:  None Available. FINDINGS: VENOUS Normal compressibility of the common femoral, superficial femoral, and popliteal veins,  as well as the visualized calf veins. Visualized portions of profunda femoral vein and great saphenous vein unremarkable. No filling defects to suggest DVT on grayscale or color Doppler imaging. Doppler waveforms show normal direction of venous flow, normal respiratory plasticity and response to augmentation. Limited views of the contralateral common femoral vein are unremarkable. OTHER None. Limitations: none IMPRESSION: Negative. Electronically Signed   By: Amie Portland M.D.   On: 05/18/2022 16:10    PROCEDURES:  Critical Care performed: N/A.    Procedures    MEDICATIONS ORDERED IN ED: Medications - No data to  display   IMPRESSION / MDM / ASSESSMENT AND PLAN / ED COURSE  I reviewed the triage vital signs and the nursing notes.                              Differential diagnosis includes, but is not limited to, cellulitis, DVTs, venous insufficiency, Baker's cyst, gastrocnemius tears.  ED Course Patient appears well, vitals within normal limits for the patient.  NAD.  CBC shows no leukocytosis or anemia.  CMP shows no significant electrolyte abnormalities, transaminitis, or AKI.  Assessment/Plan Patient presents with bilateral swelling in her lower extremities.  Warmth and erythema present in the right lower extremity suggestive of possible cellulitis.  Ultrasound reassuring for no evidence of DVTs.  Vitals are otherwise stable.  Lab work-up unremarkable.  We will plan to discharge her with a prescription for cephalexin.  Advised her to follow-up here or with her regular doctor if her symptoms fail to improve despite treatment.  Patient's presentation is most consistent with acute complicated illness / injury requiring diagnostic workup.   Considered admission, but given her negative ultrasound findings, reassuring lab work-up, and close follow-up, she is unlikely to benefit from admission.  Provided the patient with anticipatory guidance, return precautions, and educational material. Encouraged the patient to return to the emergency department at any time if they begin to experience any new or worsening symptoms. Patient expressed understanding and agreed with the plan.       FINAL CLINICAL IMPRESSION(S) / ED DIAGNOSES   Final diagnoses:  Leg swelling     Rx / DC Orders   ED Discharge Orders          Ordered    cephALEXin (KEFLEX) 500 MG capsule  4 times daily        05/18/22 1706             Note:  This document was prepared using Dragon voice recognition software and may include unintentional dictation errors.   Varney Daily, [place] 05/18/22 1710    Willy Eddy, MD 05/18/22 [phone]

## 2022-07-28 IMAGING — CR DG CHEST 2V
1 series · 2 of 2 positions shown · non-contrast
Comparison: None.

CLINICAL DATA: Chest pain

EXAM:
CHEST - 2 VIEW

[Series 1: dg chest 2 view · 0.14mm/px · 2 of 2 slices shown]
[im 1/2]
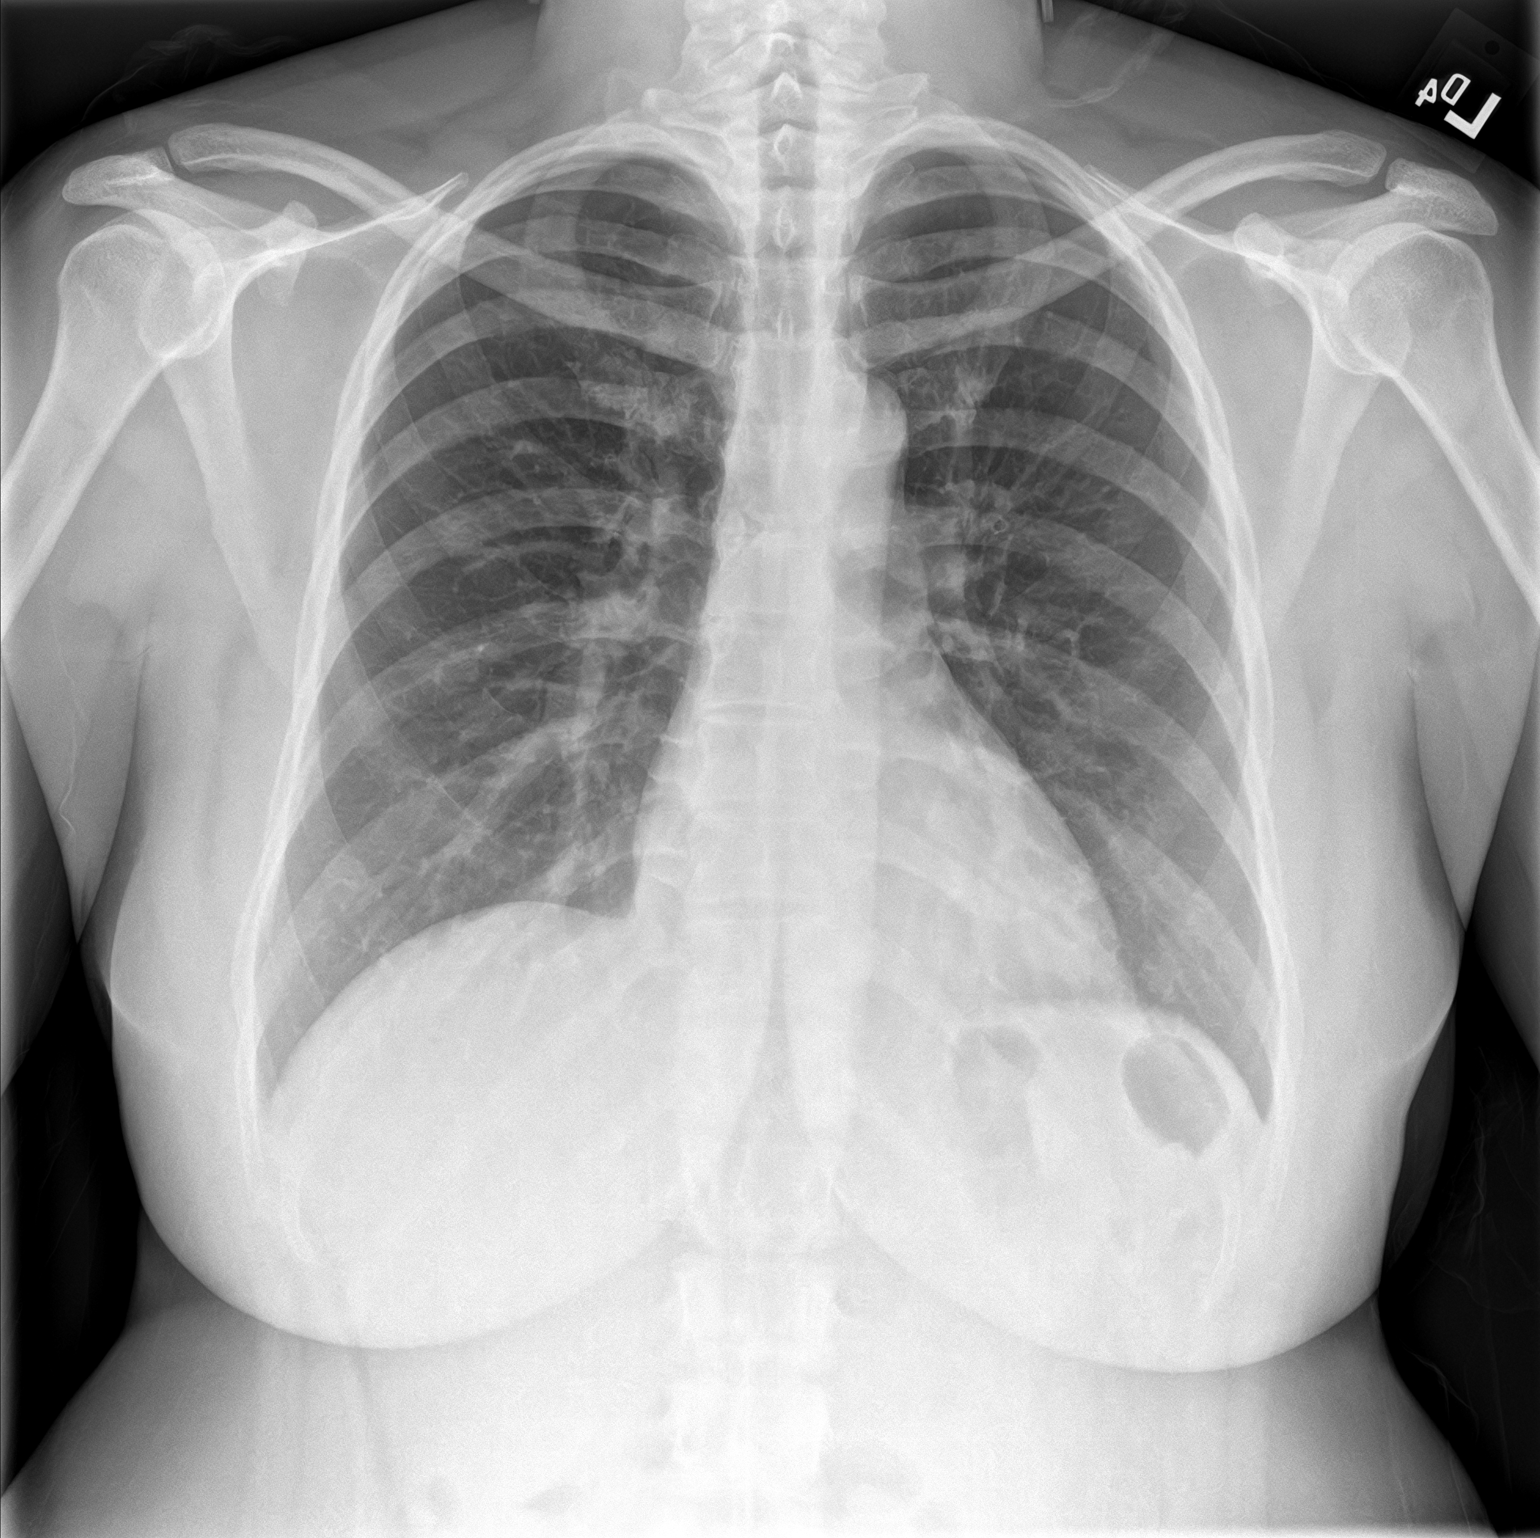
[im 2/2]
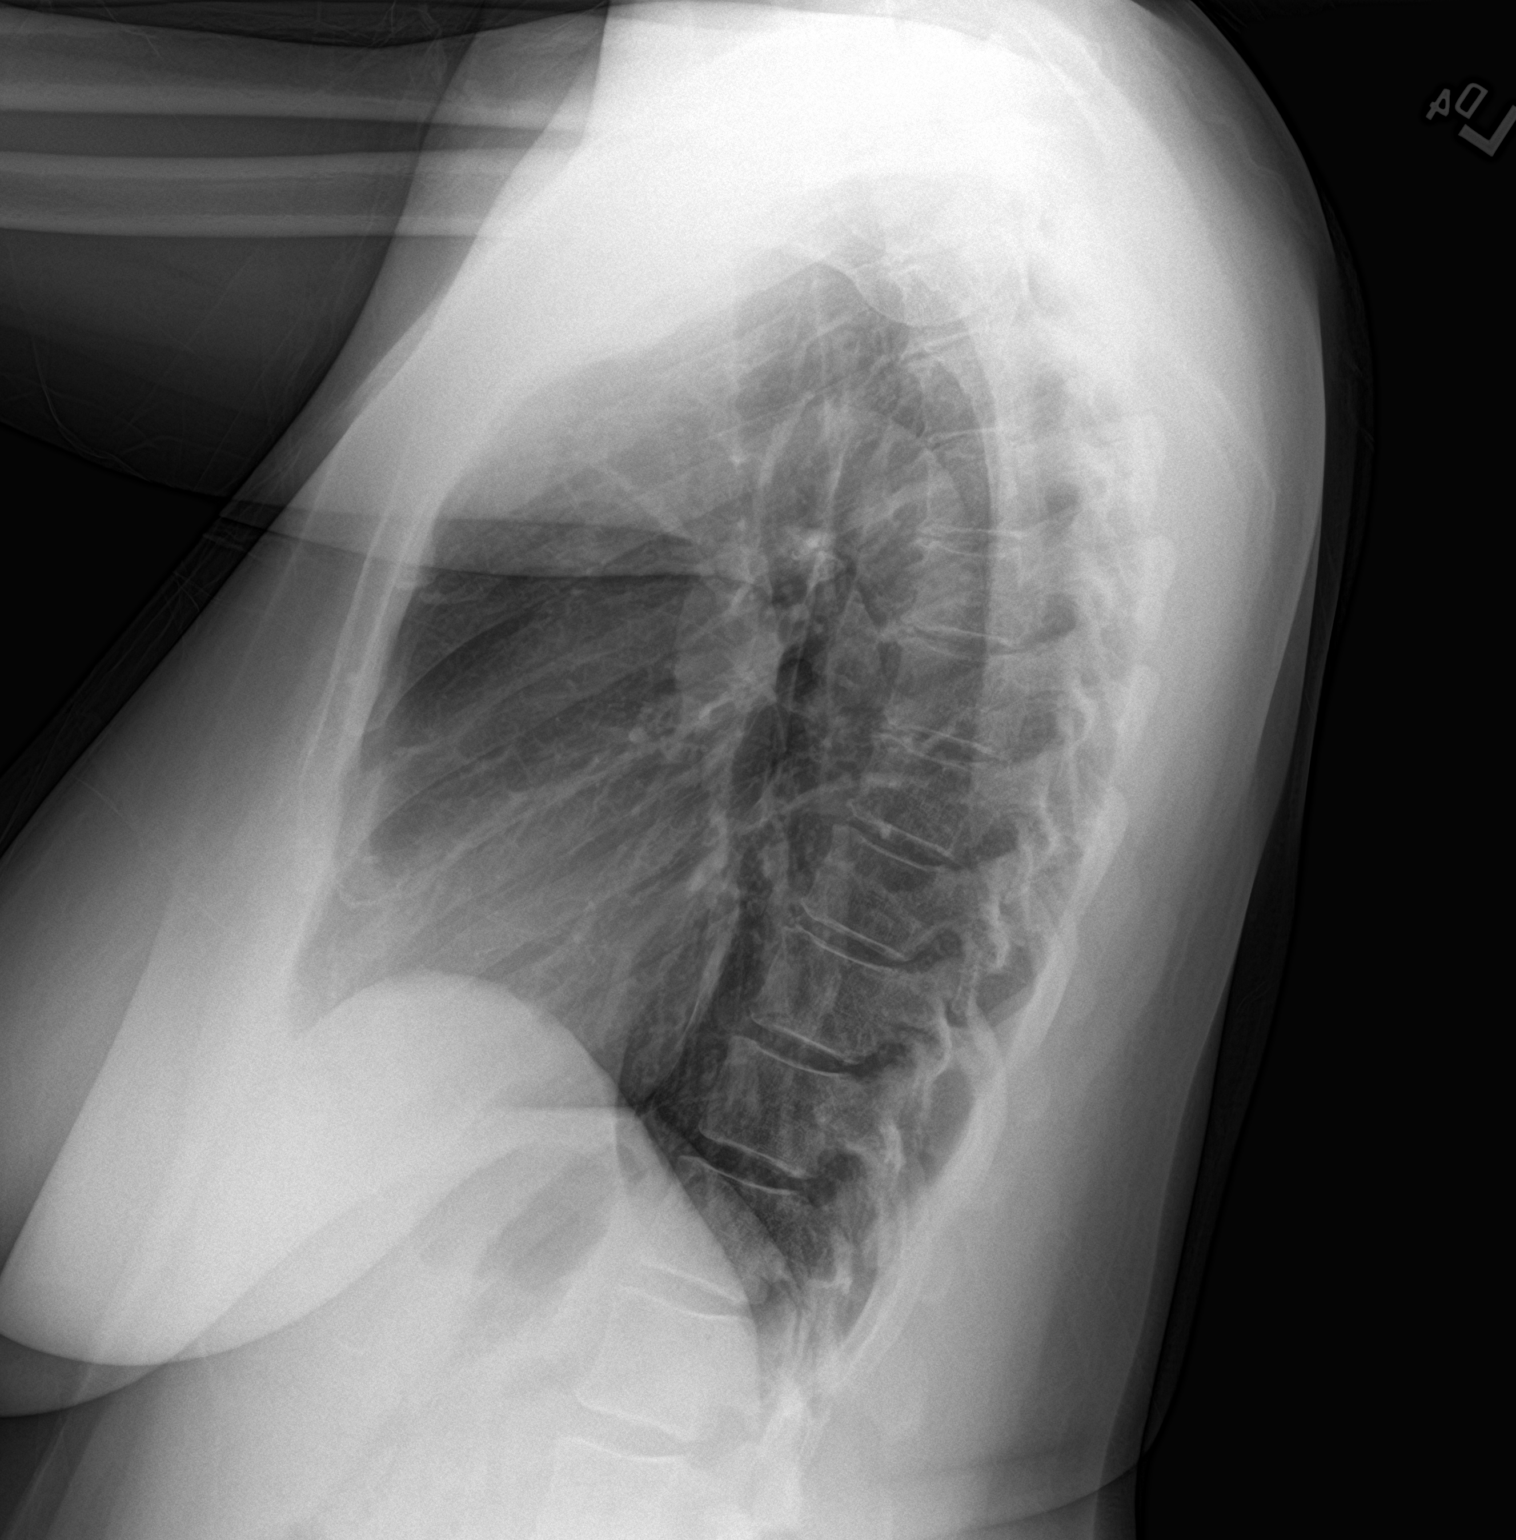

[2 of 2 positions shown; findings below may reference images not displayed]

FINDINGS: The heart size and mediastinal contours are within normal limits.
Both lungs are clear. The visualized skeletal structures are
unremarkable.
IMPRESSION: No active cardiopulmonary disease.

## 2022-08-21 IMAGING — CT CT HEART MORP W/ CTA COR W/ SCORE W/ CA W/CM &/OR W/O CM
1 of 14 series · 3 of 20 positions shown, 4 images · non-contrast
Comparison: None.

Addendum:
CLINICAL DATA: chestpain

EXAM:
Cardiac/Coronary  CTA
TECHNIQUE: The patient was scanned on a Siemens Somatoform go.Top scanner.

[Series 44: ms multiphase cta coronary 0.60 · axial · 0.36mm/px · z∈[-1139,-1080]mm · 3 of 2376 slices shown, 4 images]
[im 594/2376  vessel]
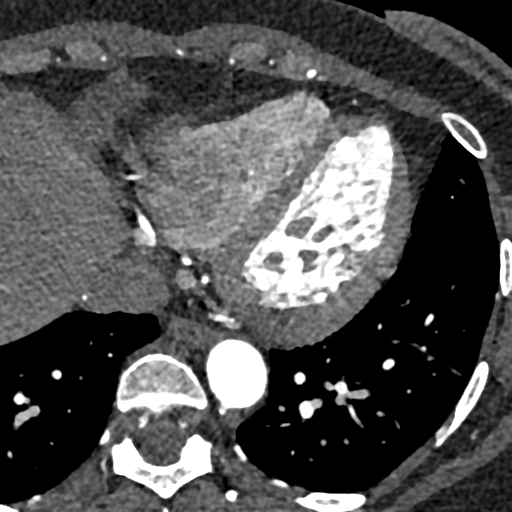
[im 594/2376  lung]
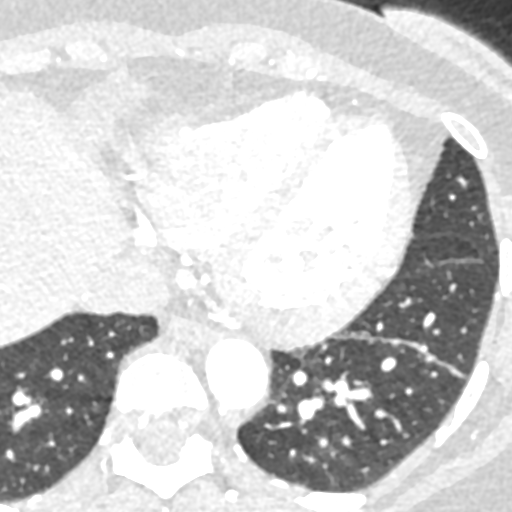
[im 1188/2376  vessel]
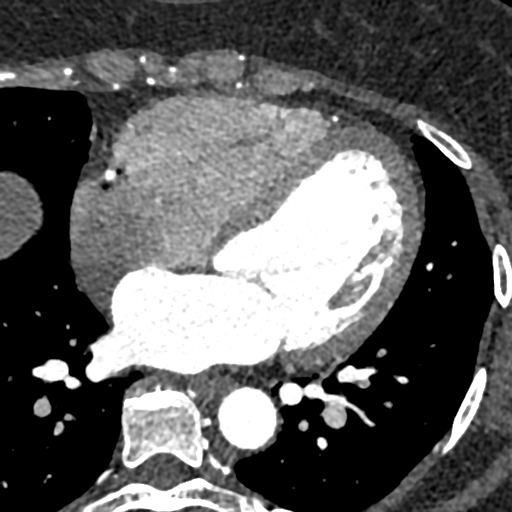
[im 1782/2376  vessel]
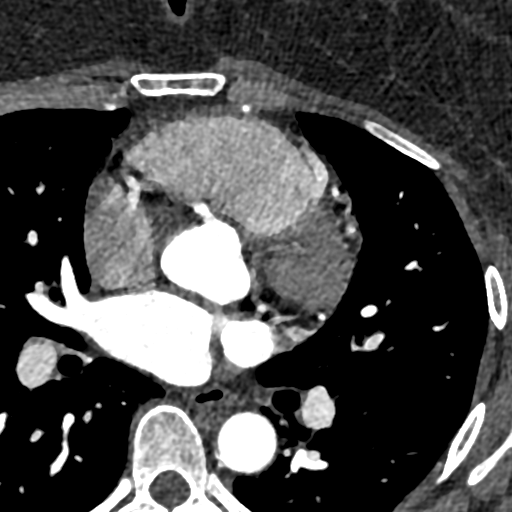

[3 of 20 positions shown; findings below may reference images not displayed]



Aortic Valve:  Trileaflet.  No calcifications.

Coronary Arteries:  Normal coronary origin.  Right dominance.

RCA is a large dominant artery that gives rise to PDA and PLA. There
is no plaque.

Left main is a large artery that gives rise to LAD and LCX arteries.

LAD is a large vessel that has no plaque.

LCX is a non-dominant artery that gives rise to two obtuse marginal
branches. There is no plaque.

Other findings:

Normal pulmonary vein drainage into the left atrium.

Normal left atrial appendage without a thrombus.

Normal size of the pulmonary artery.
IMPRESSION: 1. Coronary calcium score of 0. Patient is low risk for coronary
events

2. Normal coronary origin with right dominance.

3. No evidence of CAD.

4. CAD-RADS 0. Consider non-atherosclerotic causes of chest pain.

EXAM:
OVER-READ INTERPRETATION  CT CHEST

The following report is an over-read performed by radiologist Dr.
over-read does not include interpretation of cardiac or coronary
anatomy or pathology. The coronary CTA interpretation by the
cardiologist is attached.
FINDINGS: Limited view of the lung parenchyma demonstrates no suspicious
nodularity. Airways are normal.

Limited view of the mediastinum demonstrates no adenopathy.
Esophagus normal.

Limited view of the upper abdomen unremarkable.

Limited view of the skeleton and chest wall is unremarkable.
IMPRESSION: No significant extracardiac findings.



Aortic Valve:  Trileaflet.  No calcifications.

Coronary Arteries:  Normal coronary origin.  Right dominance.

RCA is a large dominant artery that gives rise to PDA and PLA. There
is no plaque.

Left main is a large artery that gives rise to LAD and LCX arteries.

LAD is a large vessel that has no plaque.

LCX is a non-dominant artery that gives rise to two obtuse marginal
branches. There is no plaque.

Other findings:

Normal pulmonary vein drainage into the left atrium.

Normal left atrial appendage without a thrombus.

Normal size of the pulmonary artery.
IMPRESSION: 1. Coronary calcium score of 0. Patient is low risk for coronary
events

2. Normal coronary origin with right dominance.

3. No evidence of CAD.

4. CAD-RADS 0. Consider non-atherosclerotic causes of chest pain.

## 2022-12-04 ENCOUNTER — Ambulatory Visit
Admission: EM | Admit: 2022-12-04 | Discharge: 2022-12-04 | Disposition: A | Discharge status: Home / Self Care | Payer: BC Managed Care – PPO | Attending: Urgent Care | Admitting: Urgent Care

## 2022-12-04 DIAGNOSIS — R519 Headache, unspecified: Secondary | ICD-10-CM

## 2022-12-04 MED ORDER — PROMETHAZINE HCL 25 MG PO TABS: 25.0000 mg | ORAL_TABLET | Freq: Four times a day (QID) | ORAL | 0 refills | Status: DC | PRN

## 2022-12-04 MED ORDER — PREDNISONE 20 MG PO TABS: 60.0000 mg | ORAL_TABLET | Freq: Every day | ORAL | 0 refills | Status: DC

## 2022-12-04 MED ORDER — PREDNISONE 20 MG PO TABS: 60.0000 mg | ORAL_TABLET | Freq: Every day | ORAL | 0 refills | Status: AC

## 2022-12-04 MED ORDER — PROMETHAZINE HCL 25 MG PO TABS: 25.0000 mg | ORAL_TABLET | Freq: Four times a day (QID) | ORAL | 0 refills | Status: AC | PRN

## 2022-12-04 NOTE — ED Provider Notes (Signed)
Roderic Palau    CSN: [id number] Arrival date & time: 12/04/22  1928      History   Chief Complaint Chief Complaint  Patient presents with   Headache    HPI Penny Davis is a 42 y.o. female.    Headache   Presents to urgent care with report of frontal headache x 3 days.  Denies history of frequent migraine.  Is been using ibuprofen for her symptoms.  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      Home Medications    Prior to Admission medications   Medication Sig Start Date End Date Taking? Authorizing Provider  predniSONE (DELTASONE) 20 MG tablet Take 3 tablets (60 mg total) by mouth daily with breakfast for 1 day. 12/04/22 12/05/22 Yes Orlando Thalmann, Annie Main, FNP  promethazine (PHENERGAN) 25 MG tablet Take 1 tablet (25 mg total) by mouth every 6 (six) hours as needed for nausea or vomiting. 12/04/22  Yes Tymeshia Awan, Annie Main, FNP  ALPRAZolam Duanne Moron) 0.5 MG tablet alprazolam 0.5 mg tablet    [provider]  amLODipine (NORVASC) 5 MG tablet Take by mouth. 12/11/18   [provider]  chlorhexidine (HIBICLENS) 4 % external liquid Hibiclens 4 % topical liquid  Apply 1 application 3 times a week by topical route.    [provider]  fluconazole (DIFLUCAN) 100 MG tablet fluconazole 100 mg tablet    [provider]  ibuprofen (ADVIL) 600 MG tablet ibuprofen 600 mg tablet    [provider]  labetalol (NORMODYNE) 100 MG tablet labetalol 100 mg tablet    [provider]  medroxyPROGESTERone (DEPO-PROVERA) 150 MG/ML injection Inject into the muscle. 09/19/20   [provider]  medroxyPROGESTERone Acetate 150 MG/ML SUSY Inject 1 mL  into the muscle every 3 (three) months. 07/02/20   [provider]  metoprolol tartrate (LOPRESSOR) 100 MG tablet Take 1 tablet (100 mg total) by mouth once for 1 dose. Take 2 hours prior to your CT scan. 09/13/20 09/13/20  Kate Sable, MD  terbinafine (LAMISIL) 250 MG tablet Take 1 tablet (250 mg total) by mouth daily. 10/09/20   Edrick Kins, DPM    Family History History reviewed. No pertinent family history.  Social History Social History   Tobacco Use   Smoking status: Never   Smokeless tobacco: Never  Vaping Use   Vaping Use: Never used  Substance Use Topics   Alcohol use: Not Currently   Drug use: Not Currently     Allergies   Latex and Sulfa antibiotics   Review of Systems Review of Systems  Neurological:  Positive for headaches.     Physical Exam Triage Vital Signs ED Triage Vitals  Enc Vitals Group     BP 12/04/22 1937 122/87     Pulse Rate 12/04/22 1937 88     Resp 12/04/22 1937 17     Temp 12/04/22 1937 97.6 F (36.4 C)     Temp src --      SpO2 12/04/22 1937 98 %     Weight --      Height --      Head Circumference --  Peak Flow --      Pain Score 12/04/22 1938 6     Pain Loc --      Pain Edu? --      Excl. in [place]? --    No data found.  Updated Vital Signs BP 122/87   Pulse 88   Temp 97.6 F (36.4 C)   Resp 17   LMP  (LMP Unknown)   SpO2 98%   Visual Acuity Right Eye Distance:   Left Eye Distance:   Bilateral Distance:    Right Eye Near:   Left Eye Near:    Bilateral Near:     Physical Exam Vitals reviewed.  Constitutional:      Appearance: She is well-developed.  Neurological:     Mental Status: She is alert and oriented to person, place, and time.  Psychiatric:        Mood and Affect: Mood normal.        Behavior: Behavior normal.      UC Treatments / Results  Labs (all labs ordered are listed, but only abnormal results are displayed) Labs Reviewed - No data to display  EKG   Radiology No  results found.  Procedures Procedures (including critical care time)  Medications Ordered in UC Medications - No data to display  Initial Impression / Assessment and Plan / UC Course  I have reviewed the triage vital signs and the nursing notes.  Pertinent labs & imaging results that were available during my care of the patient were reviewed by me and considered in my medical decision making (see chart for details).   Discussed migraine cocktail with the patient however she refuses IM injections of medications.  Provided alternative p.o. medications and asked her to f/up with her PCP.   Final Clinical Impressions(s) / UC Diagnoses   Final diagnoses:  Acute intractable headache, unspecified headache type     Discharge Instructions      Tonight, take promethazine 25 mg, benadryl 50 mg, with ibuprofen 800 mg. Tomorrow morning, take prednisone 60 mg.        ED Prescriptions     Medication Sig Dispense Auth. Provider   promethazine (PHENERGAN) 25 MG tablet Take 1 tablet (25 mg total) by mouth every 6 (six) hours as needed for nausea or vomiting. 6 tablet Hassen Bruun, FNP   predniSONE (DELTASONE) 20 MG tablet Take 3 tablets (60 mg total) by mouth daily with breakfast for 1 day. 3 tablet Mubashir Mallek, FNP      PDMP not reviewed this encounter.   Rose Phi, [facility] 12/04/22 2002

## 2022-12-04 NOTE — Discharge Instructions (Addendum)
Tonight, take promethazine 25 mg, benadryl 50 mg, with ibuprofen 800 mg. Tomorrow morning, take prednisone 60 mg.

## 2022-12-04 NOTE — ED Triage Notes (Signed)
Pt. Presents to UC w/ c/o a headache for the past 3 days.

## 2023-10-15 ENCOUNTER — Ambulatory Visit: Payer: Self-pay

## 2023-12-27 ENCOUNTER — Other Ambulatory Visit: Payer: Self-pay

## 2023-12-27 ENCOUNTER — Emergency Department: Payer: BC Managed Care – PPO

## 2023-12-27 DIAGNOSIS — R079 Chest pain, unspecified: Secondary | ICD-10-CM | POA: Insufficient documentation

## 2023-12-27 DIAGNOSIS — I10 Essential (primary) hypertension: Secondary | ICD-10-CM | POA: Diagnosis not present

## 2023-12-27 LAB — CBC
HCT: 40 % (ref 36.0–46.0)
Hemoglobin: 13.3 g/dL (ref 12.0–15.0)
MCH: 31.1 pg (ref 26.0–34.0)
MCHC: 33.3 g/dL (ref 30.0–36.0)
MCV: 93.5 fL (ref 80.0–100.0)
Platelets: 285 10*3/uL (ref 150–400)
RBC: 4.28 MIL/uL (ref 3.87–5.11)
RDW: 13.5 % (ref 11.5–15.5)
WBC: 6.4 10*3/uL (ref 4.0–10.5)
nRBC: 0 % (ref 0.0–0.2)

## 2023-12-27 NOTE — ED Triage Notes (Signed)
 Pt to ed from home via POV for CP x 6 hours. Pt is caox4, in no acute distress and ambulatory in triage. Pt thought she had pulled a muscle in her chest so she took a muscle relaxer and that didn't help so she came here.

## 2023-12-28 ENCOUNTER — Emergency Department
Admission: EM | Admit: 2023-12-28 | Discharge: 2023-12-28 | Disposition: A | Discharge status: Home / Self Care | Payer: BC Managed Care – PPO | Attending: Emergency Medicine | Admitting: Emergency Medicine

## 2023-12-28 DIAGNOSIS — R079 Chest pain, unspecified: Secondary | ICD-10-CM

## 2023-12-28 LAB — BASIC METABOLIC PANEL
Anion gap: 12 (ref 5–15)
BUN: 15 mg/dL (ref 6–20)
CO2: 26 mmol/L (ref 22–32)
Calcium: 9.6 mg/dL (ref 8.9–10.3)
Chloride: 101 mmol/L (ref 98–111)
Creatinine, Ser: 0.88 mg/dL (ref 0.44–1.00)
GFR, Estimated: >60 mL/min (ref >60)
Glucose, Bld: 103 mg/dL — ABNORMAL HIGH (ref 70–99)
Potassium: 3.4 mmol/L — ABNORMAL LOW (ref 3.5–5.1)
Sodium: 139 mmol/L (ref 135–145)

## 2023-12-28 LAB — TROPONIN I (HIGH SENSITIVITY)
Troponin I (High Sensitivity): 3 ng/L (ref <18)
Troponin I (High Sensitivity): 4 ng/L (ref <18)

## 2023-12-28 NOTE — ED Provider Notes (Signed)
 [facility] Provider Note    Event Date/Time   First MD Initiated Contact with Patient 12/28/23 [phone]     (approximate)   History   Chest Pain   HPI  Penny Davis is a 43 y.o. female with history of hypertension, off blood pressure medications per her PCP recommendation, presenting with left-sided chest pain that started yesterday.  States is low but sharp.  No shortness of breath.  Thought her heart rate was going in the 90s to low 100.  No calf swelling or tenderness, no nausea, vomiting, diarrhea, cough, fever, abdominal pain, back pain, urinary symptoms.  Denies history of CHF, no history of COPD or asthma, non-smoker.  No recent travel or surgeries, no history of blood clots, no history of malignancy, does not take any OCPs.  States she took a Flexeril without any improvement of symptoms.  Patient has a she was tired and does not take any hormones.     Physical Exam   Triage Vital Signs: ED Triage Vitals  Encounter Vitals Group     BP 12/27/23 2327 (!) 145/102     Systolic BP Percentile --      Diastolic BP Percentile --      Pulse Rate 12/27/23 2327 (!) 55     Resp 12/27/23 2327 18     Temp 12/27/23 2327 98.3 F (36.8 C)     Temp Source 12/27/23 2327 Oral     SpO2 12/27/23 2327 98 %     Weight --      Height 12/27/23 2318 5\' 6"  (1.676 m)     Head Circumference --      Peak Flow --      Pain Score 12/27/23 2318 5     Pain Loc --      Pain Education --      Exclude from Growth Chart --     Most recent vital signs: Vitals:   12/28/23 0223 12/28/23 0818  BP: (!) 170/96 117/76  Pulse: (!) 58 63  Resp: 18 18  Temp: 98.2 F (36.8 C) 97.9 F (36.6 C)  SpO2: 97% 100%     General: Awake, no distress.  CV:  Good peripheral perfusion.  Resp:  Normal effort.  Clear Abd:  No distention.  Soft nontender Other:  No unilateral calf swelling or tenderness, no lower extremity edema.   ED Results / Procedures / Treatments   Labs (all  labs ordered are listed, but only abnormal results are displayed) Labs Reviewed  BASIC METABOLIC PANEL - Abnormal; Notable for the following components:      Result Value   Potassium 3.4 (*)    Glucose, Bld 103 (*)    All other components within normal limits  CBC  TROPONIN I (HIGH SENSITIVITY)  TROPONIN I (HIGH SENSITIVITY)     EKG  Sinus rhythm with sinus arrhythmia, rate of 62, normal QRS, normal QTc, elevation, T wave flattening in 3, aVF, V2, T wave version to V3, not significant change compared to prior   RADIOLOGY Chest x-ray on my interpretation without focal consolidation   PROCEDURES:  Critical Care performed: No  Procedures   MEDICATIONS ORDERED IN ED: Medications - No data to display   IMPRESSION / MDM / ASSESSMENT AND PLAN / ED COURSE  I reviewed the triage vital signs and the nursing notes.  Differential diagnosis includes, but is not limited to, ACS, musculoskeletal pain, considered PE, low probability since she is not tachycardic, no history of blood clots, no recent travel or surgeries, no history of malignancy, no unilateral calf swelling or tenderness, not on any OCPs or hormones.  PERC is 0.  Labs were obtained on triage as well as chest x-ray and EKG.  Patient's presentation is most consistent with acute presentation with potential threat to life or bodily function.  Independent review of labs, Trope x 2 is negative, no leukocytosis, electrolytes not severely deranged, creatinine is normal.  EKG is nonischemic, chest x-ray without focal consolidation.  Patient is otherwise safe to go.  Her heart score is 1.  Discussed giving her some Tylenol and ibuprofen here in the emergency department but patient states that she can go home and take ibuprofen at home.  Shared decision making done with patient and she is agreeable with plan for discharge, follow-up with a primary care doctor for further management of her symptoms.  Discharge  with strict return precautions.  Clinical Course as of 12/28/23 0824  Mon Dec 28, 2023  0757 DG Chest 2 View IMPRESSION: No active cardiopulmonary disease.   [TT]    Clinical Course User Index [TT] Jodie Echevaria Franchot Erichsen, MD     FINAL CLINICAL IMPRESSION(S) / ED DIAGNOSES   Final diagnoses:  Chest pain, unspecified type     Rx / DC Orders   ED Discharge Orders     None        Note:  This document was prepared using Dragon voice recognition software and may include unintentional dictation errors.    Claybon Jabs, MD 12/28/23 [phone]

## 2023-12-28 NOTE — Discharge Instructions (Signed)
 He can take 600 mg of ibuprofen or 650 mg of Tylenol every 6 hours as needed for pain.

## 2024-01-08 ENCOUNTER — Other Ambulatory Visit: Payer: Self-pay | Admitting: Physician Assistant

## 2024-01-08 DIAGNOSIS — H539 Unspecified visual disturbance: Secondary | ICD-10-CM

## 2024-01-08 DIAGNOSIS — R519 Headache, unspecified: Secondary | ICD-10-CM

## 2024-01-29 ENCOUNTER — Ambulatory Visit
Admission: RE | Admit: 2024-01-29 | Discharge: 2024-01-29 | Disposition: A | Discharge status: Home / Self Care | Source: Ambulatory Visit | Attending: Physician Assistant | Admitting: Physician Assistant

## 2024-01-29 DIAGNOSIS — H539 Unspecified visual disturbance: Secondary | ICD-10-CM

## 2024-01-29 DIAGNOSIS — R519 Headache, unspecified: Secondary | ICD-10-CM

## 2024-01-29 MED ORDER — GADOPICLENOL 0.5 MMOL/ML IV SOLN
10.0000 mL | Freq: Once | INTRAVENOUS | Status: AC | PRN
  Administered 2024-01-29: 10 mL via INTRAVENOUS

## 2024-05-18 ENCOUNTER — Other Ambulatory Visit: Payer: Self-pay
